# Patient Record
Sex: Male | Born: 1965 | Race: White | Hispanic: No | Marital: Married | Smoking: Never smoker
Health system: Southern US, Community
[De-identification: ages and names within clinical notes are randomized; demographics above are authoritative.]

## PROBLEM LIST (undated history)

## (undated) DIAGNOSIS — R112 Nausea with vomiting, unspecified: Secondary | ICD-10-CM

## (undated) DIAGNOSIS — I10 Essential (primary) hypertension: Secondary | ICD-10-CM

## (undated) DIAGNOSIS — T753XXA Motion sickness, initial encounter: Secondary | ICD-10-CM

## (undated) DIAGNOSIS — Z9889 Other specified postprocedural states: Secondary | ICD-10-CM

## (undated) DIAGNOSIS — E785 Hyperlipidemia, unspecified: Secondary | ICD-10-CM

---

## 2014-05-23 ENCOUNTER — Ambulatory Visit: Payer: Self-pay | Admitting: Unknown Physician Specialty

## 2014-06-30 HISTORY — PX: KNEE ARTHROSCOPY: SUR90

## 2014-07-17 ENCOUNTER — Ambulatory Visit: Payer: Self-pay | Admitting: Unknown Physician Specialty

## 2014-09-13 ENCOUNTER — Ambulatory Visit: Payer: Self-pay | Admitting: Gastroenterology

## 2014-12-21 NOTE — Op Note (Signed)
PATIENT NAME:  Preston Bernard, Preston Bernard MR#:  [id number] DATE OF BIRTH:  09-04-1965  DATE OF PROCEDURE:  07/17/2014  PREOPERATIVE DIAGNOSIS: Torn medial meniscus, right knee.   POSTOPERATIVE DIAGNOSIS: Torn medial meniscus, right knee.   OPERATION: Arthroscopic partial medial meniscectomy.   SURGEON: Alda BertholdHarold B. Axten Pascucci, Jr., MD   ANESTHESIA: General.   HISTORY: The patient had a fairly long history of discomfort relating to his right knee. Plain films did not reveal any significant pathology. MRI was obtained because of persistent symptoms. It revealed a torn medial meniscus. The patient was brought in for arthroscopy of his right knee because of his persistent symptoms.   DESCRIPTION OF PROCEDURE: The patient was taken to the operating room where satisfactory general anesthesia was achieved. A tourniquet and legholder were applied to the right thigh and the left lower extremity was supported with a well leg holder. The right knee was prepped and draped in the usual fashion for an arthroscopic procedure. An inflow cannula was introduced superomedially. The joint was distended with lactated Ringer's. The scope was introduced through an inferolateral puncture wound and a probe through an inferomedial puncture. Inspection and probing of the posterior horn of the medial meniscus revealed a radial tear. The tear extended in somewhat of an oblique nature and did extend completely across the posterior horn of the medial meniscus.   I went ahead and resected the tear using a combination of basket biters and a motorized resector. The rim was further contoured with an angled ArthroCare wand.   Probing at this time revealed that the resected area seemed to be stable.   The joint surface was fairly smooth in the medial compartment. Inspection of the intercondylar notch revealed that the anterior cruciate was intact. Inspection of the lateral compartment along with probing of the lateral meniscus failed to reveal  any meniscal pathology or chondral pathology.   The trochlear groove was smooth. The retropatellar surface was also smooth.   The instruments were removed from the joint at this time. The puncture wounds were closed with 3-0 nylon in vertical mattress fashion. Several mL of 0.5% Marcaine without epinephrine were injected about each puncture wound.   Betadine was applied to the wounds, followed by a sterile dressing, and an ice pack.   The patient was awakened and transferred to his stretcher bed. He was taken to the recovery room in satisfactory condition. Blood loss was negligible. Incidentally, the tourniquet was not inflated during the course of the procedure.   ____________________________ Alda BertholdHarold B. Larance Ratledge Jr., MD hbk:ts D: 07/17/2014 13:54:52 ET T: 07/17/2014 14:32:01 ET JOB#: [id number]  cc: Alda BertholdHarold B. Rasheka Denard Jr., MD, <Dictator> Randon GoldsmithHAROLD B Clara Herbison, Montez HagemanJR MD ELECTRONICALLY SIGNED 08/18/2014 14:36

## 2018-08-24 ENCOUNTER — Other Ambulatory Visit: Payer: Self-pay | Admitting: Unknown Physician Specialty

## 2018-08-24 DIAGNOSIS — M25562 Pain in left knee: Principal | ICD-10-CM

## 2018-08-24 DIAGNOSIS — G8929 Other chronic pain: Secondary | ICD-10-CM

## 2018-09-08 ENCOUNTER — Ambulatory Visit
Admission: RE | Admit: 2018-09-08 | Discharge: 2018-09-08 | Disposition: A | Discharge status: Home / Self Care | Payer: BLUE CROSS/BLUE SHIELD | Source: Ambulatory Visit | Attending: Unknown Physician Specialty | Admitting: Unknown Physician Specialty

## 2018-09-08 ENCOUNTER — Encounter (INDEPENDENT_AMBULATORY_CARE_PROVIDER_SITE_OTHER): Payer: Self-pay

## 2018-09-08 DIAGNOSIS — G8929 Other chronic pain: Secondary | ICD-10-CM | POA: Insufficient documentation

## 2018-09-08 DIAGNOSIS — M25562 Pain in left knee: Secondary | ICD-10-CM | POA: Insufficient documentation

## 2018-10-30 ENCOUNTER — Encounter: Payer: Self-pay | Admitting: *Deleted

## 2018-10-30 ENCOUNTER — Other Ambulatory Visit: Payer: Self-pay

## 2018-11-08 ENCOUNTER — Ambulatory Visit
Admission: RE | Admit: 2018-11-08 | Discharge: 2018-11-08 | Disposition: A | Discharge status: Home / Self Care | Payer: BLUE CROSS/BLUE SHIELD | Attending: Surgery | Admitting: Surgery

## 2018-11-08 ENCOUNTER — Ambulatory Visit: Payer: BLUE CROSS/BLUE SHIELD | Admitting: Anesthesiology

## 2018-11-08 ENCOUNTER — Encounter
Admission: RE | Disposition: A | Discharge status: Home / Self Care | Payer: Self-pay | Source: Home / Self Care | Attending: Surgery

## 2018-11-08 DIAGNOSIS — Z7982 Long term (current) use of aspirin: Secondary | ICD-10-CM | POA: Insufficient documentation

## 2018-11-08 DIAGNOSIS — Z79899 Other long term (current) drug therapy: Secondary | ICD-10-CM | POA: Insufficient documentation

## 2018-11-08 DIAGNOSIS — X58XXXA Exposure to other specified factors, initial encounter: Secondary | ICD-10-CM | POA: Diagnosis not present

## 2018-11-08 DIAGNOSIS — M6752 Plica syndrome, left knee: Secondary | ICD-10-CM | POA: Diagnosis not present

## 2018-11-08 DIAGNOSIS — S83232A Complex tear of medial meniscus, current injury, left knee, initial encounter: Secondary | ICD-10-CM | POA: Diagnosis not present

## 2018-11-08 DIAGNOSIS — I10 Essential (primary) hypertension: Secondary | ICD-10-CM | POA: Diagnosis not present

## 2018-11-08 HISTORY — PX: KNEE ARTHROSCOPY WITH MEDIAL MENISECTOMY: SHX5651

## 2018-11-08 HISTORY — DX: Other specified postprocedural states: R11.2

## 2018-11-08 HISTORY — DX: Other specified postprocedural states: Z98.890

## 2018-11-08 HISTORY — DX: Hyperlipidemia, unspecified: E78.5

## 2018-11-08 HISTORY — DX: Motion sickness, initial encounter: T75.3XXA

## 2018-11-08 HISTORY — DX: Essential (primary) hypertension: I10

## 2018-11-08 SURGERY — ARTHROSCOPY, KNEE, WITH MEDIAL MENISCECTOMY
Anesthesia: General | Site: Knee | Laterality: Left

## 2018-11-08 MED ORDER — OXYCODONE HCL 5 MG PO TABS
5.0000 mg | ORAL_TABLET | Freq: Once | ORAL | Status: AC | PRN
  Administered 2018-11-08: 5 mg via ORAL

## 2018-11-08 MED ORDER — LIDOCAINE HCL (CARDIAC) PF 100 MG/5ML IV SOSY
PREFILLED_SYRINGE | INTRAVENOUS | Status: DC | PRN
  Administered 2018-11-08: 30 mg via INTRATRACHEAL

## 2018-11-08 MED ORDER — OXYCODONE HCL 5 MG/5ML PO SOLN: 5.0000 mg | Freq: Once | ORAL | Status: AC | PRN

## 2018-11-08 MED ORDER — DEXAMETHASONE SODIUM PHOSPHATE 4 MG/ML IJ SOLN
INTRAMUSCULAR | Status: DC | PRN
  Administered 2018-11-08: 4 mg via INTRAVENOUS

## 2018-11-08 MED ORDER — PROPOFOL 10 MG/ML IV BOLUS
INTRAVENOUS | Status: DC | PRN
  Administered 2018-11-08: 200 mg via INTRAVENOUS

## 2018-11-08 MED ORDER — ONDANSETRON HCL 4 MG/2ML IJ SOLN
INTRAMUSCULAR | Status: DC | PRN
  Administered 2018-11-08: 4 mg via INTRAVENOUS

## 2018-11-08 MED ORDER — LACTATED RINGERS IV SOLN
INTRAVENOUS | Status: DC
  Administered 2018-11-08: 11:00:00 via INTRAVENOUS

## 2018-11-08 MED ORDER — BUPIVACAINE HCL 0.5 % IJ SOLN
INTRAMUSCULAR | Status: DC | PRN
  Administered 2018-11-08: 30 mL

## 2018-11-08 MED ORDER — BUPIVACAINE-EPINEPHRINE (PF) 0.5% -1:200000 IJ SOLN: INTRAMUSCULAR | Status: DC | PRN

## 2018-11-08 MED ORDER — LIDOCAINE-EPINEPHRINE 1 %-1:100000 IJ SOLN
INTRAMUSCULAR | Status: DC | PRN
  Administered 2018-11-08: 10 mL
  Administered 2018-11-08: 30 mL

## 2018-11-08 MED ORDER — POTASSIUM CHLORIDE IN NACL 20-0.9 MEQ/L-% IV SOLN: INTRAVENOUS | Status: DC

## 2018-11-08 MED ORDER — CLINDAMYCIN PHOSPHATE 900 MG/50ML IV SOLN
900.0000 mg | Freq: Four times a day (QID) | INTRAVENOUS | Status: DC
  Administered 2018-11-08: 900 mg via INTRAVENOUS

## 2018-11-08 MED ORDER — SCOPOLAMINE 1 MG/3DAYS TD PT72
1.0000 | MEDICATED_PATCH | TRANSDERMAL | Status: DC
  Administered 2018-11-08: 1.5 mg via TRANSDERMAL

## 2018-11-08 MED ORDER — HYDROCODONE-ACETAMINOPHEN 5-325 MG PO TABS: 1.0000 | ORAL_TABLET | Freq: Four times a day (QID) | ORAL | 0 refills | Status: DC | PRN

## 2018-11-08 MED ORDER — FENTANYL CITRATE (PF) 100 MCG/2ML IJ SOLN
INTRAMUSCULAR | Status: DC | PRN
  Administered 2018-11-08: 25 ug via INTRAVENOUS
  Administered 2018-11-08: 50 ug via INTRAVENOUS
  Administered 2018-11-08: 25 ug via INTRAVENOUS

## 2018-11-08 MED ORDER — GLYCOPYRROLATE 0.2 MG/ML IJ SOLN
INTRAMUSCULAR | Status: DC | PRN
  Administered 2018-11-08: 0.1 mg via INTRAVENOUS

## 2018-11-08 MED ORDER — FENTANYL CITRATE (PF) 100 MCG/2ML IJ SOLN: 25.0000 ug | INTRAMUSCULAR | Status: DC | PRN

## 2018-11-08 MED ORDER — ONDANSETRON HCL 4 MG/2ML IJ SOLN: 4.0000 mg | Freq: Once | INTRAMUSCULAR | Status: DC | PRN

## 2018-11-08 MED ORDER — MIDAZOLAM HCL 5 MG/5ML IJ SOLN
INTRAMUSCULAR | Status: DC | PRN
  Administered 2018-11-08: 2 mg via INTRAVENOUS

## 2018-11-08 SURGICAL SUPPLY — 29 items
BANDAGE ELASTIC 6 LF NS (GAUZE/BANDAGES/DRESSINGS) ×3 IMPLANT
BLADE FULL RADIUS 3.5 (BLADE) ×3 IMPLANT
CHLORAPREP W/TINT 26ML (MISCELLANEOUS) ×3 IMPLANT
COVER LIGHT HANDLE UNIVERSAL (MISCELLANEOUS) ×6 IMPLANT
CUFF TOURN SGL QUICK 34 (TOURNIQUET CUFF) ×2
CUFF TRNQT CYL 34X4X40X1 (TOURNIQUET CUFF) IMPLANT
DRAPE IMP U-DRAPE 54X76 (DRAPES) ×3 IMPLANT
FASTFIX NDL DEL SYS 360 CVD (Miscellaneous) ×6 IMPLANT
GAUZE SPONGE 4X4 12PLY STRL (GAUZE/BANDAGES/DRESSINGS) ×3 IMPLANT
GLOVE BIO SURGEON STRL SZ8 (GLOVE) ×6 IMPLANT
GLOVE INDICATOR 8.0 STRL GRN (GLOVE) ×3 IMPLANT
GOWN STRL REUS W/ TWL LRG LVL3 (GOWN DISPOSABLE) ×1 IMPLANT
GOWN STRL REUS W/ TWL XL LVL3 (GOWN DISPOSABLE) ×1 IMPLANT
GOWN STRL REUS W/TWL LRG LVL3 (GOWN DISPOSABLE) ×2
GOWN STRL REUS W/TWL XL LVL3 (GOWN DISPOSABLE) ×2
IV LACTATED RINGER IRRG 3000ML (IV SOLUTION) ×4
IV LR IRRIG 3000ML ARTHROMATIC (IV SOLUTION) ×2 IMPLANT
KIT TURNOVER KIT A (KITS) ×3 IMPLANT
MANIFOLD 4PT FOR NEPTUNE1 (MISCELLANEOUS) ×3 IMPLANT
NDL HYPO 21X1.5 SAFETY (NEEDLE) ×2 IMPLANT
NEEDLE HYPO 21X1.5 SAFETY (NEEDLE) ×6 IMPLANT
PACK ARTHROSCOPY KNEE (MISCELLANEOUS) ×3 IMPLANT
PUSHER KNOT ARTHRO STRT FASTFI (MISCELLANEOUS) ×2 IMPLANT
STRAP BODY AND KNEE 60X3 (MISCELLANEOUS) ×3 IMPLANT
SUT PROLENE 4 0 PS 2 18 (SUTURE) ×3 IMPLANT
SYR 50ML LL SCALE MARK (SYRINGE) ×3 IMPLANT
SYSTEM NDL DEL FSTFX  360 CVD (Miscellaneous) IMPLANT
TUBING ARTHRO INFLOW-ONLY STRL (TUBING) ×3 IMPLANT
WAND 30 DEG SABER W/CORD (SURGICAL WAND) ×3 IMPLANT

## 2018-11-08 NOTE — Anesthesia Postprocedure Evaluation (Signed)
Anesthesia Post Note  Patient: Preston Bernard  Procedure(s) Performed: KNEE ARTHROSCOPY WITH DEBRIDEMENT AND REPAIR VERSUS PARTIAL MEDIAL MENISECTOMY (Left Knee)  Patient location during evaluation: PACU Anesthesia Type: General Level of consciousness: awake and alert Pain management: pain level controlled Vital Signs Assessment: post-procedure vital signs reviewed and stable Respiratory status: spontaneous breathing, nonlabored ventilation, respiratory function stable and patient connected to nasal cannula oxygen Cardiovascular status: blood pressure returned to baseline and stable Postop Assessment: no apparent nausea or vomiting Anesthetic complications: no    Scarlette Slice

## 2018-11-08 NOTE — Op Note (Signed)
11/08/2018  12:22 PM  Patient:   Preston Bernard  Pre-Op Diagnosis:   Complex medial meniscus tear, left knee.  Postoperative diagnosis:   Complex medial meniscus tear with symptomatic suprapatellar plica, left knee.  Procedure:   Arthroscopic debridement with repair of medial meniscus tear and excision of suprapatellar plica, left knee.  Surgeon:   Maryagnes Amos, M.D.  Anesthesia:   General LMA.  Findings:   As above.  There was a complex oblique tear of the midportion of the medial meniscus with a flap component.  The lateral meniscus was in satisfactory condition, as were the anterior and posterior cruciate ligaments.  There were grade 2 chondromalacial changes involving the medial facet of the patella and grade 1 chondromalacial changes involving the femoral trochlea, the lateral femoral condyle, and the lateral tibial plateau.  Complications:   None.  EBL:   5 cc.  Total fluids:   500 cc of crystalloid.  Tourniquet time:   None  Drains:   None  Closure:   4-0 Prolene interrupted sutures.  Brief clinical note:   The patient is a 53 year old male with a several month history of medial sided left knee pain. His symptoms have persisted despite medications, activity modification, etc. His history and examination are consistent with a medial meniscus tear, confirmed by MRI scan. The patient presents at this time for arthroscopy, debridement, and repair versus partial ileal meniscectomy.  Procedure:   The patient was brought into the operating room and lain in the supine position. After adequate general laryngeal mask anesthesia was obtained, a timeout was performed to verify the appropriate side. The patient's left knee was injected sterilely using a solution of 30 cc of 1% lidocaine with epinephrine and 30 cc of 0.5% Sensorcaine. The left lower extremity was prepped with ChloraPrep solution before being draped sterilely. Preoperative antibiotics were administered. The expected  portal sites were injected with 1% lidocaine with epinephrine before the camera was placed in the anterolateral portal and instrumentation performed through the anteromedial portal. The knee was sequentially examined beginning in the suprapatellar pouch, then progressing to the patellofemoral space, the medial gutter compartment, the notch, and finally the lateral compartment and gutter. The findings were as described above. Abundant reactive synovial tissues anteriorly were debrided using the full-radius resector in order to improve visualization. While debriding these tissues, a large thickened suprapatellar plica was identified which appeared to be abrading the medial edge of the medial femoral condyle. Therefore, this was debrided as well using the full-radius resector.  The torn portion of the medial meniscus was carefully probed. Given the patient's age and relative good condition of the medial compartment, it is felt best to try to salvage as much of the meniscus as possible. Therefore, after lightly debriding the torn margins of the meniscus with the full-radius resector and meniscal rasp, the unstable flap component was repaired using two [employer] FasT-Fix 360 devices. The repair was probed and found to be stable. The frayed margins of the meniscal edges were debrided back to stable margins using the straight mini munchers. The instruments were removed from the joint after suctioning the excess fluid.   The portal sites were closed using 4-0 Prolene interrupted sutures before a sterile bulky dressing was applied to the knee. The patient was then awakened, extubated, and returned to the recovery room in satisfactory condition after tolerating the procedure well.

## 2018-11-08 NOTE — Anesthesia Preprocedure Evaluation (Signed)
Anesthesia Evaluation  Patient identified by MRN, date of birth, ID band Patient awake    Reviewed: Allergy & Precautions, H&P , NPO status , Patient's Chart, lab work & pertinent test results, reviewed documented beta blocker date and time   History of Anesthesia Complications (+) PONV and history of anesthetic complications  Airway Mallampati: II  TM Distance: >3 FB Neck ROM: full    Dental no notable dental hx.    Pulmonary neg pulmonary ROS,    Pulmonary exam normal breath sounds clear to auscultation       Cardiovascular Exercise Tolerance: Good hypertension,  Rhythm:regular Rate:Normal     Neuro/Psych negative neurological ROS  negative psych ROS   GI/Hepatic negative GI ROS, Neg liver ROS,   Endo/Other  negative endocrine ROS  Renal/GU negative Renal ROS  negative genitourinary   Musculoskeletal   Abdominal   Peds  Hematology negative hematology ROS (+)   Anesthesia Other Findings   Reproductive/Obstetrics negative OB ROS                             Anesthesia Physical Anesthesia Plan  ASA: II  Anesthesia Plan: General   Post-op Pain Management:    Induction:   PONV Risk Score and Plan: 3 and Ondansetron, Dexamethasone and Scopolamine patch - Pre-op  Airway Management Planned:   Additional Equipment:   Intra-op Plan:   Post-operative Plan:   Informed Consent: I have reviewed the patients History and Physical, chart, labs and discussed the procedure including the risks, benefits and alternatives for the proposed anesthesia with the patient or authorized representative who has indicated his/her understanding and acceptance.     Dental Advisory Given  Plan Discussed with: CRNA  Anesthesia Plan Comments:         Anesthesia Quick Evaluation

## 2018-11-08 NOTE — Transfer of Care (Signed)
Immediate Anesthesia Transfer of Care Note  Patient: Preston Bernard  Procedure(s) Performed: KNEE ARTHROSCOPY WITH DEBRIDEMENT AND REPAIR VERSUS PARTIAL MEDIAL MENISECTOMY (Left Knee)  Patient Location: PACU  Anesthesia Type: General  Level of Consciousness: awake, alert  and patient cooperative  Airway and Oxygen Therapy: Patient Spontanous Breathing and Patient connected to supplemental oxygen  Post-op Assessment: Post-op Vital signs reviewed, Patient's Cardiovascular Status Stable, Respiratory Function Stable, Patent Airway and No signs of Nausea or vomiting  Post-op Vital Signs: Reviewed and stable  Complications: No apparent anesthesia complications

## 2018-11-08 NOTE — H&P (Signed)
Paper H&P to be scanned into permanent record. H&P reviewed and patient re-examined. No changes. 

## 2018-11-08 NOTE — Anesthesia Procedure Notes (Signed)
Procedure Name: LMA Insertion Date/Time: 11/08/2018 11:29 AM Performed by: Maree Krabbe, CRNA Pre-anesthesia Checklist: Patient identified, Emergency Drugs available, Suction available, Timeout performed and Patient being monitored Patient Re-evaluated:Patient Re-evaluated prior to induction Oxygen Delivery Method: Circle system utilized Preoxygenation: Pre-oxygenation with 100% oxygen Induction Type: IV induction LMA: LMA inserted LMA Size: 5.0 Number of attempts: 1 Placement Confirmation: positive ETCO2 and breath sounds checked- equal and bilateral Tube secured with: Tape Dental Injury: Teeth and Oropharynx as per pre-operative assessment

## 2018-11-08 NOTE — Discharge Instructions (Signed)
Scopolamine skin patches REMOVE PATCH IN 72 HOURS AND IMMEDIATELY WASH HANDS What is this medicine? SCOPOLAMINE (skoe POL a meen) is used to prevent nausea and vomiting caused by motion sickness, anesthesia and surgery. This medicine may be used for other purposes; ask your health care provider or pharmacist if you have questions. COMMON BRAND NAME(S): Transderm Scop What should I tell my health care provider before I take this medicine? They need to know if you have any of these conditions: -are scheduled to have a gastric secretion test -glaucoma -heart disease -kidney disease -liver disease -lung or breathing disease, like asthma -mental illness -prostate disease -seizures -stomach or intestine problems -trouble passing urine -an unusual or allergic reaction to scopolamine, atropine, other medicines, foods, dyes, or preservatives -pregnant or trying to get pregnant -breast-feeding How should I use this medicine? This medicine is for external use only. Follow the directions on the prescription label. Wear only 1 patch at a time. Choose an area behind the ear, that is clean, dry, hairless and free from any cuts or irritation. Wipe the area with a clean dry tissue. Peel off the plastic backing of the skin patch, trying not to touch the adhesive side with your hands. Do not cut the patches. Firmly apply to the area you have chosen, with the metallic side of the patch to the skin and the tan-colored side showing. Once firmly in place, wash your hands well with soap and water. Do not get this medicine into your eyes. After removing the patch, wash your hands and the area behind your ear thoroughly with soap and water. The patch will still contain some medicine after use. To avoid accidental contact or ingestion by children or pets, fold the used patch in half with the sticky side together and throw away in the trash out of the reach of children and pets. If you need to use a second patch after  you remove the first, place it behind the other ear. A special MedGuide will be given to you by the pharmacist with each prescription and refill. Be sure to read this information carefully each time. Talk to your pediatrician regarding the use of this medicine in children. Special care may be needed. Overdosage: If you think you have taken too much of this medicine contact a poison control center or emergency room at once. NOTE: This medicine is only for you. Do not share this medicine with others. What if I miss a dose? This does not apply. This medicine is not for regular use. What may interact with this medicine? -alcohol -antihistamines for allergy cough and cold -atropine -certain medicines for anxiety or sleep -certain medicines for bladder problems like oxybutynin, tolterodine -certain medicines for depression like amitriptyline, fluoxetine, sertraline -certain medicines for stomach problems like dicyclomine, hyoscyamine -certain medicines for Parkinson's disease like benztropine, trihexyphenidyl -certain medicines for seizures like phenobarbital, primidone -general anesthetics like halothane, isoflurane, methoxyflurane, propofol -ipratropium -local anesthetics like lidocaine, pramoxine, tetracaine -medicines that relax muscles for surgery -phenothiazines like chlorpromazine, mesoridazine, prochlorperazine, thioridazine -narcotic medicines for pain -other belladonna alkaloids This list may not describe all possible interactions. Give your health care provider a list of all the medicines, herbs, non-prescription drugs, or dietary supplements you use. Also tell them if you smoke, drink alcohol, or use illegal drugs. Some items may interact with your medicine. What should I watch for while using this medicine? Limit contact with water while swimming and bathing because the patch may fall off. If the patch falls off,  throw it away and put a new one behind the other ear. You may get  drowsy or dizzy. Do not drive, use machinery, or do anything that needs mental alertness until you know how this medicine affects you. Do not stand or sit up quickly, especially if you are an older patient. This reduces the risk of dizzy or fainting spells. Alcohol may interfere with the effect of this medicine. Avoid alcoholic drinks. Your mouth may get dry. Chewing sugarless gum or sucking hard candy, and drinking plenty of water may help. Contact your healthcare professional if the problem does not go away or is severe. This medicine may cause dry eyes and blurred vision. If you wear contact lenses, you may feel some discomfort. Lubricating drops may help. See your healthcare professional if the problem does not go away or is severe. If you are going to need surgery, an MRI, CT scan, or other procedure, tell your healthcare professional that you are using this medicine. You may need to remove the patch before the procedure. What side effects may I notice from receiving this medicine? Side effects that you should report to your doctor or health care professional as soon as possible: -allergic reactions like skin rash, itching or hives; swelling of the face, lips, or tongue -blurred vision -changes in vision -confusion -dizziness -eye pain -fast, irregular heartbeat -hallucinations, loss of contact with reality -nausea, vomiting -pain or trouble passing urine -restlessness -seizures -skin irritation -stomach pain Side effects that usually do not require medical attention (report to your doctor or health care professional if they continue or are bothersome): -drowsiness -dry mouth -headache -sore throat This list may not describe all possible side effects. Call your doctor for medical advice about side effects. You may report side effects to FDA at 1-800-FDA-1088. Where should I keep my medicine? Keep out of the reach of children. Store at room temperature between 20 and 25 degrees C (68  and 77 degrees F). Keep this medicine in the foil package until ready to use. Throw away any unused medicine after the expiration date. NOTE: This sheet is a summary. It may not cover all possible information. If you have questions about this medicine, talk to your doctor, pharmacist, or health care provider.  2019 Elsevier/Gold Standard (2017-11-04 16:14:46)   General Anesthesia, Adult, Care After This sheet gives you information about how to care for yourself after your procedure. Your health care provider may also give you more specific instructions. If you have problems or questions, contact your health care provider. What can I expect after the procedure? After the procedure, the following side effects are common:  Pain or discomfort at the IV site.  Nausea.  Vomiting.  Sore throat.  Trouble concentrating.  Feeling cold or chills.  Weak or tired.  Sleepiness and fatigue.  Soreness and body aches. These side effects can affect parts of the body that were not involved in surgery. Follow these instructions at home:  For at least 24 hours after the procedure:  Have a responsible adult stay with you. It is important to have someone help care for you until you are awake and alert.  Rest as needed.  Do not: ? Participate in activities in which you could fall or become injured. ? Drive. ? Use heavy machinery. ? Drink alcohol. ? Take sleeping pills or medicines that cause drowsiness. ? Make important decisions or sign legal documents. ? Take care of children on your own. Eating and drinking  Follow any instructions from  your health care provider about eating or drinking restrictions.  When you feel hungry, start by eating small amounts of foods that are soft and easy to digest (bland), such as toast. Gradually return to your regular diet.  Drink enough fluid to keep your urine pale yellow.  If you vomit, rehydrate by drinking water, juice, or clear broth. General  instructions  If you have sleep apnea, surgery and certain medicines can increase your risk for breathing problems. Follow instructions from your health care provider about wearing your sleep device: ? Anytime you are sleeping, including during daytime naps. ? While taking prescription pain medicines, sleeping medicines, or medicines that make you drowsy.  Return to your normal activities as told by your health care provider. Ask your health care provider what activities are safe for you.  Take over-the-counter and prescription medicines only as told by your health care provider.  If you smoke, do not smoke without supervision.  Keep all follow-up visits as told by your health care provider. This is important. Contact a health care provider if:  You have nausea or vomiting that does not get better with medicine.  You cannot eat or drink without vomiting.  You have pain that does not get better with medicine.  You are unable to pass urine.  You develop a skin rash.  You have a fever.  You have redness around your IV site that gets worse. Get help right away if:  You have difficulty breathing.  You have chest pain.  You have blood in your urine or stool, or you vomit blood. Summary  After the procedure, it is common to have a sore throat or nausea. It is also common to feel tired.  Have a responsible adult stay with you for the first 24 hours after general anesthesia. It is important to have someone help care for you until you are awake and alert.  When you feel hungry, start by eating small amounts of foods that are soft and easy to digest (bland), such as toast. Gradually return to your regular diet.  Drink enough fluid to keep your urine pale yellow.  Return to your normal activities as told by your health care provider. Ask your health care provider what activities are safe for you. This information is not intended to replace advice given to you by your health care  provider. Make sure you discuss any questions you have with your health care provider. Document Released: 11/22/2000 Document Revised: 04/01/2017 Document Reviewed: 04/01/2017 Elsevier Interactive Patient Education  2019 Elsevier Inc.   Orthopedic discharge instructions: Keep dressing dry and intact.  May shower after dressing changed on post-op day #4 (Sunday).  Cover sutures with Band-Aids after drying off. Apply ice frequently to knee. Take ibuprofen 600-800 mg TID with meals for 7-10 days, then as necessary. Take pain medication as prescribed or ES Tylenol when needed.  May weight-bear as tolerated - use crutches or walker as needed. Follow-up in 10-14 days or as scheduled.

## 2018-11-09 ENCOUNTER — Encounter: Payer: Self-pay | Admitting: Surgery

## 2020-04-21 IMAGING — MR MR KNEE*L* W/O CM
6 series · 40 of 40 positions shown · non-contrast
Comparison: None.

CLINICAL DATA: Chronic left knee pain.

EXAM:
MRI OF THE LEFT KNEE WITHOUT CONTRAST
TECHNIQUE: Multiplanar, multisequence MR imaging of the knee was performed. No
intravenous contrast was administered.

[Series 3: T2 fat-sat · axial · 4.0mm · 0.53mm/px · z∈[-85,+85]mm · 8 of 35 slices shown (1 of 3)]
[im 1/35]
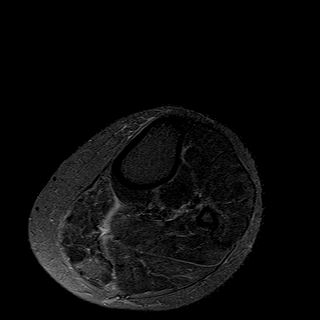
[im 5/35]
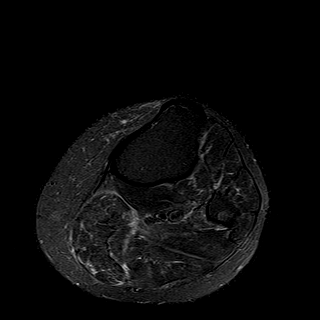
[im 10/35]
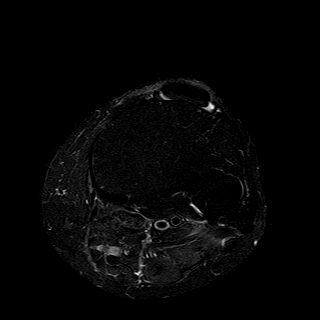
[im 15/35]
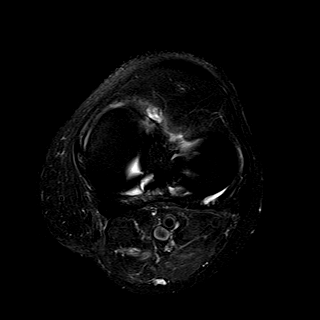
[im 20/35]
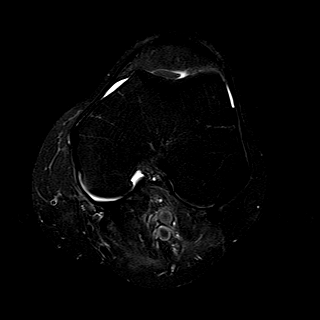
[im 25/35]
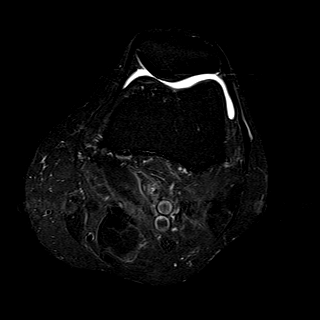
[im 30/35]
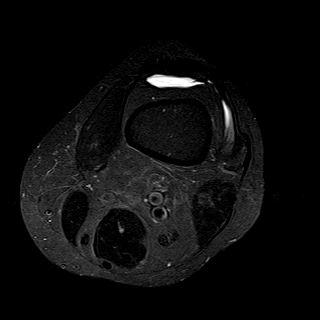
[im 35/35]
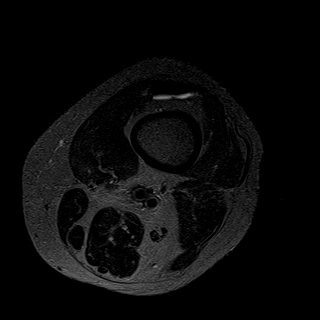

[Series 4: T1 · coronal · 4.0mm · 0.62mm/px · 6 of 31 slices shown]
[im 1/31]
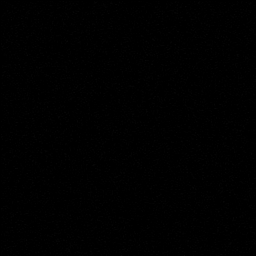
[im 7/31]
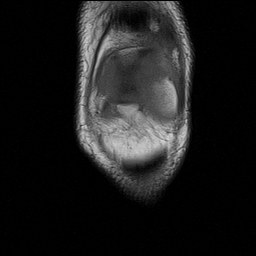
[im 13/31]
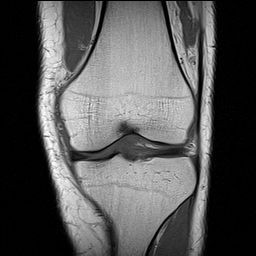
[im 19/31]
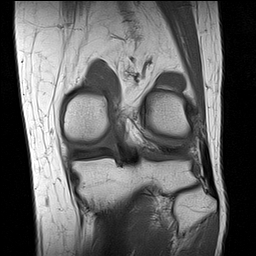
[im 25/31]
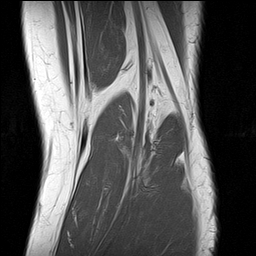
[im 31/31]
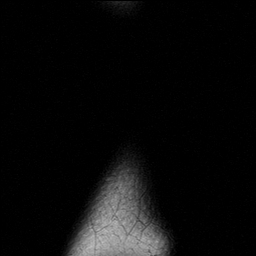

[Series 5: T2 fat-sat · coronal · 4.0mm · 0.62mm/px · 6 of 31 slices shown (2 of 3)]
[im 1/31]
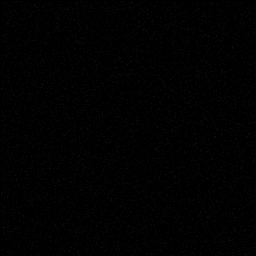
[im 7/31]
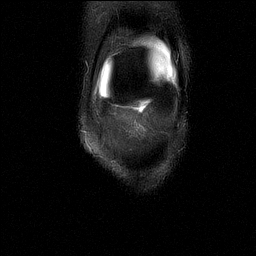
[im 13/31]
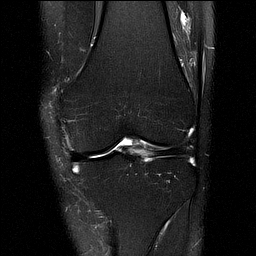
[im 19/31]
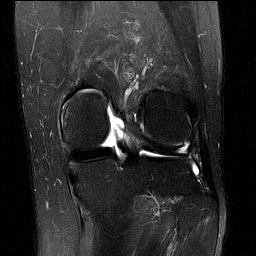
[im 25/31]
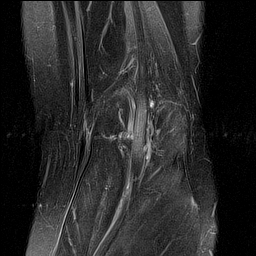
[im 31/31]
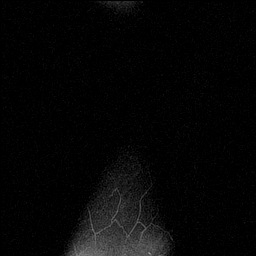

[Series 6: PD fat-sat · coronal · 4.0mm · 0.62mm/px · 6 of 31 slices shown (1 of 2)]
[im 1/31]
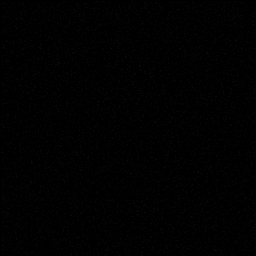
[im 7/31]
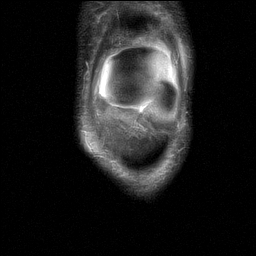
[im 13/31]
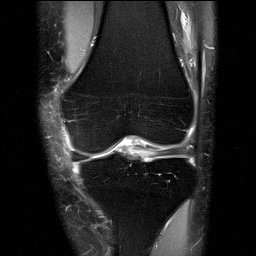
[im 19/31]
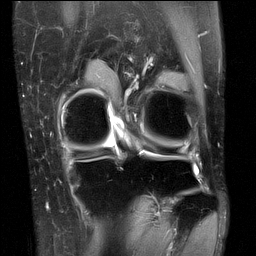
[im 25/31]
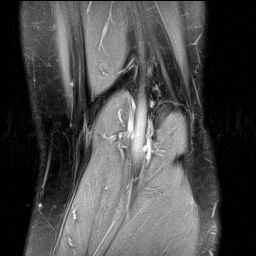
[im 31/31]
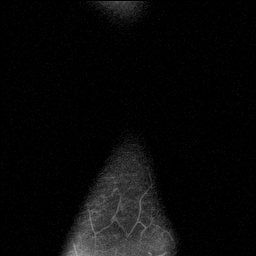

[Series 7: PD fat-sat · sagittal · 3.0mm · 0.62mm/px · 7 of 32 slices shown (2 of 2)]
[im 1/32]
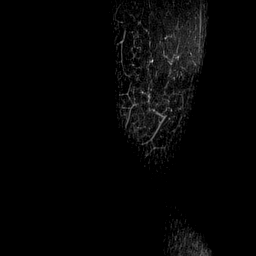
[im 6/32]
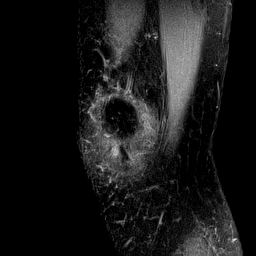
[im 11/32]
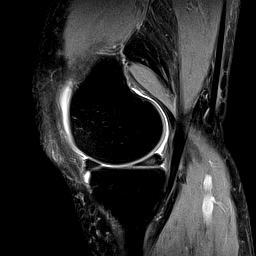
[im 16/32]
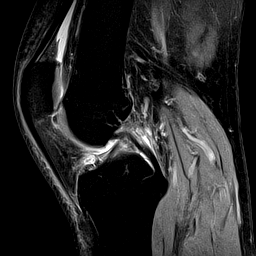
[im 21/32]
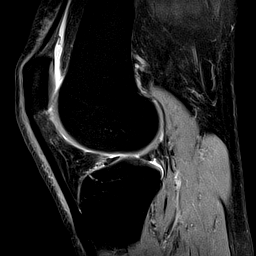
[im 26/32]
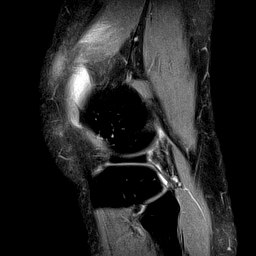
[im 32/32]
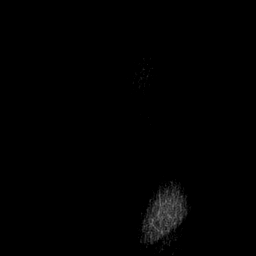

[Series 8: T2 fat-sat · sagittal · 3.0mm · 0.62mm/px · 7 of 32 slices shown (3 of 3)]
[im 1/32]
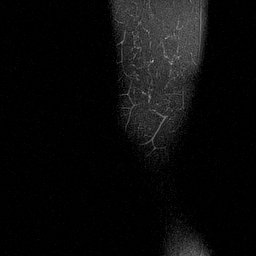
[im 6/32]
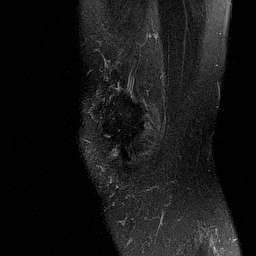
[im 11/32]
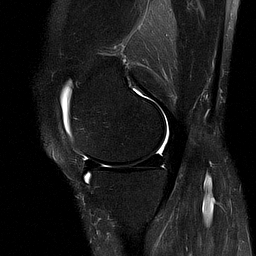
[im 16/32]
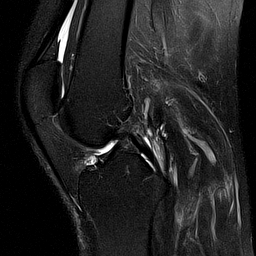
[im 21/32]
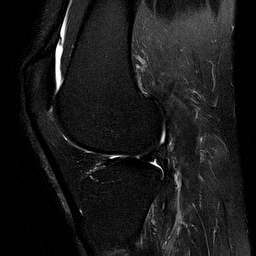
[im 26/32]
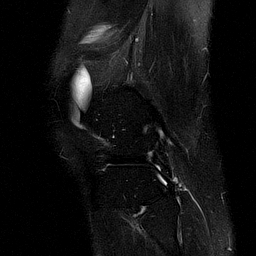
[im 32/32]
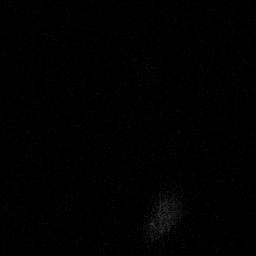

[40 of 40 positions shown; findings below may reference images not displayed]

FINDINGS: MENISCI

Medial meniscus: There is a complex tear of the midbody and
posterior horn. There is a free edge tear of the midbody and there
is a horizontal tear at the posteromedial corner extending along the
undersurface from the longitudinal tear of the free edge of the
body.

Lateral meniscus:  Normal.

LIGAMENTS

Cruciates:  Normal.

Collaterals:  Normal.

CARTILAGE

Patellofemoral:  Normal.

Medial:  Normal.

Lateral:  Normal.

Joint: Small joint effusion. Normal Hoffa's fat pad. No plical
thickening.

Popliteal Fossa:  No Baker cyst. Intact popliteus tendon.

Extensor Mechanism:  Normal.

Bones:  Normal.

Other: None
IMPRESSION: 1. Complex tear midbody and posterior horn of the medial meniscus.
2. Small joint effusion.

## 2022-01-27 ENCOUNTER — Ambulatory Visit: Payer: Self-pay | Admitting: Urology

## 2022-02-09 ENCOUNTER — Encounter: Payer: Self-pay | Admitting: Urology

## 2022-02-09 ENCOUNTER — Other Ambulatory Visit
Admission: RE | Admit: 2022-02-09 | Discharge: 2022-02-09 | Disposition: A | Discharge status: Home / Self Care | Payer: No Typology Code available for payment source | Attending: Urology | Admitting: Urology

## 2022-02-09 ENCOUNTER — Ambulatory Visit (INDEPENDENT_AMBULATORY_CARE_PROVIDER_SITE_OTHER): Payer: Self-pay | Admitting: Urology

## 2022-02-09 VITALS — BP 148/93 | HR 76 | Ht 71.0 in | Wt 206.0 lb

## 2022-02-09 DIAGNOSIS — R972 Elevated prostate specific antigen [PSA]: Secondary | ICD-10-CM | POA: Insufficient documentation

## 2022-02-09 NOTE — Progress Notes (Signed)
   02/09/22 11:11 AM   Preston Bernard [birth year] [id number]  CC: Elevated PSA  HPI: Healthy 56 year old male with no family history of prostate cancer, family history of lethal breast cancer in his mother, who presents with an elevated PSA.  PSA on 01/14/2022 was elevated at 14.2, which had increased significantly from last value in October 2021 that was 0.69.  He denies any urinary symptoms or gross hematuria.  No prior cross-sectional imaging to review.   PMH: Past Medical History:  Diagnosis Date   Hyperlipidemia    Hypertension    Motion sickness    circular rides   PONV (postoperative nausea and vomiting)    after knee surgery 2015    Surgical History: Past Surgical History:  Procedure Laterality Date   KNEE ARTHROSCOPY Right 06/2014   [facility], Dr. Gavin Potters   KNEE ARTHROSCOPY WITH MEDIAL MENISECTOMY Left 11/08/2018   Procedure: KNEE ARTHROSCOPY WITH DEBRIDEMENT AND REPAIR VERSUS PARTIAL MEDIAL MENISECTOMY;  Surgeon: Christena Flake, MD;  Location: [facility];  Service: Orthopedics;  Laterality: Left;  [employer]PHEW  MENISCAL REPAIR STUFF     Social History:  reports that he has never smoked. He has never used smokeless tobacco. He reports current alcohol use of about 2.0 standard drinks of alcohol per week. No history on file for drug use.  Physical Exam: BP (!) 148/93 (BP Location: Left Arm, Patient Position: Sitting, Cuff Size: Large)   Pulse 76   Ht 5\' 11"  (1.803 m)   Wt 206 lb (93.4 kg)   BMI 28.73 kg/m    Constitutional:  Alert and oriented, No acute distress. Cardiovascular: No clubbing, cyanosis, or edema. Respiratory: Normal respiratory effort, no increased work of breathing. GI: Abdomen is soft, nontender, nondistended, no abdominal masses DRE: 40 g, smooth, no nodules or masses  Laboratory Data: Reviewed, see HPI  Pertinent Imaging: None to review  Assessment & Plan:   Healthy 56 year old male with single elevated PSA of 14.2 which is  increased significantly from 0.69 in 2021.  We reviewed the implications of an elevated PSA and the uncertainty surrounding it. In general, a man's PSA increases with age and is produced by both normal and cancerous prostate tissue. The differential diagnosis for elevated PSA includes BPH, prostate cancer, infection, recent intercourse/ejaculation, recent urethroscopic manipulation (foley placement/cystoscopy) or trauma, and prostatitis.   Management of an elevated PSA can include observation or prostate biopsy and we discussed this in detail. Our goal is to detect clinically significant prostate cancers, and manage with either active surveillance, surgery, or radiation for localized disease. Risks of prostate biopsy include bleeding, infection (including life threatening sepsis), pain, and lower urinary symptoms. Hematuria, hematospermia, and blood in the stool are all common after biopsy and can persist up to 4 weeks.   Repeat PSA today, if remains elevated we will schedule prostate biopsy   2022, MD 02/09/2022  [facility] [address], [address] [place], [place] [phone]

## 2022-02-09 NOTE — Patient Instructions (Signed)
Prostate Cancer Screening  Prostate cancer screening is testing that is done to check for the presence of prostate cancer in men. The prostate gland is a walnut-sized gland that is located below the bladder and in front of the rectum in males. The function of the prostate is to add fluid to semen during ejaculation. Prostate cancer is one of the most common types of cancer in men. Who should have prostate cancer screening? Screening recommendations vary based on age and other risk factors, as well as between the professional organizations who make the recommendations. In general, screening is recommended if: You are age 50 to 70 and have an average risk for prostate cancer. You should talk with your health care provider about your need for screening and how often screening should be done. Because most prostate cancers are slow growing and will not cause death, screening in this age group is generally reserved for men who have a 10- to 15-year life expectancy. You are younger than age 50, and you have these risk factors: Having a father, brother, or uncle who has been diagnosed with prostate cancer. The risk is higher if your family member's cancer occurred at an early age or if you have multiple family members with prostate cancer at an early age. Being a male who is Black or is of Caribbean or sub-Saharan African descent. In general, screening is not recommended if: You are younger than age 40. You are between the ages of 40 and 49 and you have no risk factors. You are 70 years of age or older. At this age, the risks that screening can cause are greater than the benefits that it may provide. If you are at high risk for prostate cancer, your health care provider may recommend that you have screenings more often or that you start screening at a younger age. How is screening for prostate cancer done? The recommended prostate cancer screening test is a blood test called the prostate-specific antigen  (PSA) test. PSA is a protein that is made in the prostate. As you age, your prostate naturally produces more PSA. Abnormally high PSA levels may be caused by: Prostate cancer. An enlarged prostate that is not caused by cancer (benign prostatic hyperplasia, or BPH). This condition is very common in older men. A prostate gland infection (prostatitis) or urinary tract infection. Certain medicines such as male hormones (like testosterone) or other medicines that raise testosterone levels. A rectal exam may be done as part of prostate cancer screening to help provide information about the size of your prostate gland. When a rectal exam is performed, it should be done after the PSA level is drawn to avoid any effect on the results. Depending on the PSA results, you may need more tests, such as: A physical exam to check the size of your prostate gland, if not done as part of screening. Blood and imaging tests. A procedure to remove tissue samples from your prostate gland for testing (biopsy). This is the only way to know for certain if you have prostate cancer. What are the benefits of prostate cancer screening? Screening can help to identify cancer at an early stage, before symptoms start and when the cancer can be treated more easily. There is a small chance that screening may lower your risk of dying from prostate cancer. The chance is small because prostate cancer is a slow-growing cancer, and most men with prostate cancer die from a different cause. What are the risks of prostate cancer screening? The   main risk of prostate cancer screening is diagnosing and treating prostate cancer that would never have caused any symptoms or problems. This is called overdiagnosisand overtreatment. PSA screening cannot tell you if your PSA is high due to cancer or a different cause. A prostate biopsy is the only procedure to diagnose prostate cancer. Even the results of a biopsy may not tell you if your cancer needs to  be treated. Slow-growing prostate cancer may not need any treatment other than monitoring, so diagnosing and treating it may cause unnecessary stress or other side effects. Questions to ask your health care provider When should I start prostate cancer screening? What is my risk for prostate cancer? How often do I need screening? What type of screening tests do I need? How do I get my test results? What do my results mean? Do I need treatment? Where to find more information The American Cancer Society: www.cancer.org American Urological Association: www.auanet.org Contact a health care provider if: You have difficulty urinating. You have pain when you urinate or ejaculate. You have blood in your urine or semen. You have pain in your back or in the area of your prostate. Summary Prostate cancer is a common type of cancer in men. The prostate gland is located below the bladder and in front of the rectum. This gland adds fluid to semen during ejaculation. Prostate cancer screening may identify cancer at an early stage, when the cancer can be treated more easily and is less likely to have spread to other areas of the body. The prostate-specific antigen (PSA) test is the recommended screening test for prostate cancer, but it has associated risks. Discuss the risks and benefits of prostate cancer screening with your health care provider. If you are age 70 or older, the risks that screening can cause are greater than the benefits that it may provide. This information is not intended to replace advice given to you by your health care provider. Make sure you discuss any questions you have with your health care provider. Document Revised: 02/09/2021 Document Reviewed: 02/09/2021 Elsevier Patient Education  2023 Else[employer] Inc.  Transrectal Ultrasound-Guided Prostate Biopsy A transrectal ultrasound-guided prostate biopsy is a procedure to remove samples of prostate tissue for testing. The prostate is a  walnut-sized gland that is located below the bladder and in front of the rectum. During this procedure, a small device (probe) is lubricated and put inside the rectum. The probe sends out sound waves that make a picture of the prostate and surrounding tissues (transrectal ultrasound). The images are used to help guide the process of removing the samples. The samples are taken to a lab to be checked for prostate cancer. This procedure is usually done to evaluate the prostate gland of men who have raised (elevated) levels of prostate-specific antigen (PSA), which can be a sign of prostate cancer or prostate enlargement related to aging (benign prostatic hyperplasia, or BPH). Tell a health care provider about: Any allergies you have. All medicines you are taking, including vitamins, herbs, eye drops, creams, and over-the-counter medicines. Any problems you or family members have had with anesthetic medicines. Any bleeding problems you have. Any surgeries you have had. Any medical conditions you have. Any prostate infections you have had. What are the risks? Generally, this is a safe procedure. However, problems may occur, including: Prostate infection. Bleeding from the rectum. Blood in the urine. Allergic reactions to medicines. Damage to surrounding structures such as blood vessels, organs, or muscles. Difficulty passing urine. Nerve damage. This   is usually temporary. What happens before the procedure? Medicines Ask your health care provider about: Changing or stopping your regular medicines. This is especially important if you are taking diabetes medicines or blood thinners. Taking medicines such as aspirin and ibuprofen. These medicines can thin your blood. Do not take these medicines unless your health care provider tells you to take them. Taking over-the-counter medicines, vitamins, herbs, and supplements. General instructions Follow instructions from your health care provider about  eating and drinking. In most instances, you will not need to stop eating and drinking completely before the procedure. You will be given an enema. During an enema, a liquid is injected into your rectum to clear out waste. You may have a blood or urine sample taken. Ask your health care provider what steps will be taken to help prevent infection. These steps may include: Washing skin with a germ-killing soap. Taking antibiotic medicine. If you will be going home right after the procedure, plan to have a responsible adult: Take you home from the hospital or clinic. You will not be allowed to drive. Care for you for the time you are told. What happens during the procedure?  An IV will be inserted into one of your veins. You will be given one or both of the following: A medicine to help you relax (sedative). A medicine to numb the area (local anesthetic). You will be placed on your left side, and your knees will be bent toward your chest. A probe with lubricated gel will be placed into your rectum, and images will be taken of your prostate and surrounding structures. Numbing medicine will be injected into your prostate. A biopsy needle will be inserted through your rectum or perineum and guided to your prostate using the ultrasound images. Prostate tissue samples will be removed, and the needle and probe will then be removed. The biopsy samples will be sent to a lab to be tested. The procedure may vary among health care providers and hospitals. What happens after the procedure? Your blood pressure, heart rate, breathing rate, and blood oxygen level will be monitored until you leave the hospital or clinic. You may have some discomfort in the rectal area. You will be given pain medicine as needed. If you were given a sedative during the procedure, it can affect you for several hours. Do not drive or operate machinery until your health care provider says that it is safe. It is up to you to get the  results of your procedure. Ask your health care provider, or the department that is doing the procedure, when your results will be ready. Keep all follow-up visits. This is important. Summary A transrectal ultrasound-guided biopsy removes samples of tissue from your prostate using ultrasound-guided sound waves to help guide the process. This procedure is usually done to evaluate the prostate gland of men who have raised (elevated) levels of prostate-specific antigen (PSA), which can be a sign of prostate cancer or prostate enlargement related to aging. After your procedure, you may feel some discomfort in the rectal area. Plan to have a responsible adult take you home from the hospital or clinic, and follow up with your health care provider for your results. This information is not intended to replace advice given to you by your health care provider. Make sure you discuss any questions you have with your health care provider. Document Revised: 02/09/2021 Document Reviewed: 02/09/2021 Elsevier Patient Education  2023 Elsevier Inc.  

## 2022-02-10 LAB — PSA, TOTAL AND FREE
PSA, Free Pct: 24.6 %
PSA, Free: 0.32 ng/mL
Prostate Specific Ag, Serum: 1.3 ng/mL (ref 0.0–4.0)
